# Patient Record
Sex: Female | Born: 2000 | Hispanic: Yes | Smoking: Never smoker
Health system: Southern US, Community
[De-identification: ages and names within clinical notes are randomized; demographics above are authoritative.]

## PROBLEM LIST (undated history)

## (undated) DIAGNOSIS — J45909 Unspecified asthma, uncomplicated: Secondary | ICD-10-CM

## (undated) DIAGNOSIS — R55 Syncope and collapse: Secondary | ICD-10-CM

## (undated) HISTORY — PX: WISDOM TOOTH EXTRACTION: SHX21

## (undated) HISTORY — DX: Unspecified asthma, uncomplicated: J45.909

---

## 2019-06-24 ENCOUNTER — Encounter: Payer: Self-pay | Admitting: Neurology

## 2019-06-24 ENCOUNTER — Ambulatory Visit: Payer: Self-pay | Admitting: Neurology

## 2019-06-24 ENCOUNTER — Telehealth: Payer: Self-pay

## 2019-06-24 NOTE — Telephone Encounter (Signed)
PT no show for new pt appt.

## 2019-07-05 ENCOUNTER — Ambulatory Visit: Payer: Self-pay | Admitting: Neurology

## 2019-08-03 ENCOUNTER — Encounter: Payer: Self-pay | Admitting: Neurology

## 2019-08-03 ENCOUNTER — Ambulatory Visit: Payer: 59 | Admitting: Neurology

## 2019-08-03 ENCOUNTER — Other Ambulatory Visit: Payer: Self-pay

## 2019-08-03 VITALS — BP 97/72 | HR 78 | Ht 66.0 in | Wt 175.6 lb

## 2019-08-03 DIAGNOSIS — R6889 Other general symptoms and signs: Secondary | ICD-10-CM | POA: Diagnosis not present

## 2019-08-03 DIAGNOSIS — R55 Syncope and collapse: Secondary | ICD-10-CM | POA: Diagnosis not present

## 2019-08-03 DIAGNOSIS — R569 Unspecified convulsions: Secondary | ICD-10-CM

## 2019-08-03 NOTE — Patient Instructions (Addendum)
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  I recommend further evaluation by checking echocardiogram, 30-day heart monitoring, EEG and MRI scan of the brain.  She was advised to increase participation in stress laxation activities like meditation and yoga and exercise.  She was advised not to drive for 6 months since her last episode as per [facility].  She will return for follow-up in the future in 2 months or call earlier if necessary.  Syncope Syncope is when you pass out (faint) for a short time. It is caused by a sudden decrease in blood flow to the brain. Signs that you may be about to pass out include:  Feeling dizzy or light-headed.  Feeling sick to your stomach (nauseous).  Seeing all white or all black.  Having cold, clammy skin. If you pass out, get help right away. Call your local emergency services (911 in the [place] Do not drive yourself to the hospital. Follow these instructions at home: Watch for any changes in your symptoms. Take these actions to stay safe and help with your symptoms: Lifestyle  Do not drive, use machinery, or play sports until your doctor says it is okay.  Do not drink alcohol.  Do not use any products that contain nicotine or tobacco, such as cigarettes and e-cigarettes. If you need help quitting, ask your doctor.  Drink enough fluid to keep your pee (urine) pale yellow. General instructions  Take over-the-counter and prescription medicines only as told by your doctor.  If you are taking blood pressure or heart medicine, sit up and stand up slowly. Spend a few minutes getting ready to sit and then stand. This can help you feel less dizzy.  Have someone stay with you until you feel stable.  If you start to feel like you might pass out, lie down right away and raise (elevate) your feet above the level of your heart.  Breathe deeply and steadily. Wait until all of the symptoms are gone.  Keep all follow-up visits as told by your doctor. This is important. Get help right away if:  You have a very bad headache.  You pass out once or more than once.  You have pain in your chest, belly, or back.  You have a very fast or uneven heartbeat (palpitations).  It hurts to breathe.  You are bleeding from your mouth or your bottom (rectum).  You have black or tarry poop (stool).  You have jerky movements that you cannot control (seizure).  You are confused.  You have trouble walking.  You are very weak.  You have vision problems. These symptoms may be an emergency. Do not wait to see if the symptoms will go away. Get medical help right away. Call your local emergency services (911 in the [place] Do not drive yourself to the hospital. Summary  Syncope is when you pass out (faint) for a short time. It is caused by a sudden decrease in blood flow to the brain.  Signs that you may be about to faint include feeling dizzy, light-headed, or sick to your stomach, seeing all white or all black, or having cold, clammy skin.  If you start to feel like you might pass out, lie down right away and raise (elevate) your feet above the level of your heart. Breathe deeply and steadily. Wait until all of the symptoms are gone. This information is not intended  to replace advice given to you by your health care provider. Make sure you discuss any questions you have with your health care provider. Document Revised: 03/19/2017 Document Reviewed: 03/19/2017 Elsevier Patient Education  2020 [employer].

## 2019-08-04 DIAGNOSIS — R55 Syncope and collapse: Secondary | ICD-10-CM | POA: Insufficient documentation
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
## 2019-08-05 ENCOUNTER — Telehealth: Payer: Self-pay | Admitting: Neurology

## 2019-08-05 NOTE — Telephone Encounter (Signed)
no to the covid questions MR Brain w/wo contrast Dr. Pearlean Brownie [facility] Auth: [id number] (exp. 08/05/19 to 09/19/19). Patient is scheduled at [facility] for 08/31/19

## 2019-08-30 ENCOUNTER — Other Ambulatory Visit: Payer: 59

## 2019-08-31 ENCOUNTER — Other Ambulatory Visit: Payer: 59

## 2019-09-01 ENCOUNTER — Other Ambulatory Visit (HOSPITAL_COMMUNITY): Payer: 59

## 2019-09-30 ENCOUNTER — Other Ambulatory Visit (HOSPITAL_COMMUNITY): Payer: 59

## 2019-10-04 ENCOUNTER — Telehealth (HOSPITAL_COMMUNITY): Payer: Self-pay | Admitting: Neurology

## 2019-10-04 ENCOUNTER — Encounter (HOSPITAL_COMMUNITY): Payer: Self-pay | Admitting: Neurology

## 2019-10-04 NOTE — Telephone Encounter (Signed)
Just an FYI. We have made several attempts to contact this patient including sending a letter to schedule or reschedule their echocardiogram. We will be removing the patient from the echo WQ.    10/04/19 MAILED LETTER  09/30/19 PT NO SHOWED  PA# [id number] 08/10/19 thru 09/24/19 (per inbasket) DC 08/10/19 Inbasket resent for PA#  08/03/19 INBASKET sent for PA#    Thank you

## 2019-10-11 ENCOUNTER — Ambulatory Visit: Payer: 59 | Admitting: Neurology

## 2019-11-25 ENCOUNTER — Encounter: Payer: Self-pay | Admitting: Neurology

## 2019-11-25 ENCOUNTER — Ambulatory Visit: Payer: 59 | Admitting: Neurology

## 2021-06-08 ENCOUNTER — Emergency Department (HOSPITAL_BASED_OUTPATIENT_CLINIC_OR_DEPARTMENT_OTHER): Payer: 59

## 2021-06-08 ENCOUNTER — Encounter (HOSPITAL_BASED_OUTPATIENT_CLINIC_OR_DEPARTMENT_OTHER): Payer: Self-pay | Admitting: Emergency Medicine

## 2021-06-08 ENCOUNTER — Emergency Department (HOSPITAL_BASED_OUTPATIENT_CLINIC_OR_DEPARTMENT_OTHER)
Admission: EM | Admit: 2021-06-08 | Discharge: 2021-06-08 | Disposition: A | Discharge status: Home / Self Care | Payer: 59 | Attending: Emergency Medicine | Admitting: Emergency Medicine

## 2021-06-08 ENCOUNTER — Other Ambulatory Visit: Payer: Self-pay

## 2021-06-08 DIAGNOSIS — R197 Diarrhea, unspecified: Secondary | ICD-10-CM | POA: Insufficient documentation

## 2021-06-08 DIAGNOSIS — R110 Nausea: Secondary | ICD-10-CM | POA: Diagnosis not present

## 2021-06-08 DIAGNOSIS — R509 Fever, unspecified: Secondary | ICD-10-CM | POA: Diagnosis not present

## 2021-06-08 DIAGNOSIS — R630 Anorexia: Secondary | ICD-10-CM | POA: Diagnosis not present

## 2021-06-08 DIAGNOSIS — R1031 Right lower quadrant pain: Secondary | ICD-10-CM | POA: Diagnosis not present

## 2021-06-08 DIAGNOSIS — D72829 Elevated white blood cell count, unspecified: Secondary | ICD-10-CM | POA: Diagnosis not present

## 2021-06-08 DIAGNOSIS — R1030 Lower abdominal pain, unspecified: Secondary | ICD-10-CM | POA: Diagnosis present

## 2021-06-08 LAB — URINALYSIS, ROUTINE W REFLEX MICROSCOPIC
Bilirubin Urine: NEGATIVE
Glucose, UA: NEGATIVE mg/dL
Ketones, ur: NEGATIVE mg/dL
Leukocytes,Ua: NEGATIVE
Nitrite: NEGATIVE
Protein, ur: NEGATIVE mg/dL
Specific Gravity, Urine: 1.017 (ref 1.005–1.030)
pH: 7 (ref 5.0–8.0)

## 2021-06-08 LAB — COMPREHENSIVE METABOLIC PANEL
ALT: 12 U/L (ref 0–44)
AST: 15 U/L (ref 15–41)
Albumin: 4.6 g/dL (ref 3.5–5.0)
Alkaline Phosphatase: 54 U/L (ref 38–126)
Anion gap: 9 (ref 5–15)
BUN: 12 mg/dL (ref 6–20)
CO2: 27 mmol/L (ref 22–32)
Calcium: 9.3 mg/dL (ref 8.9–10.3)
Chloride: 102 mmol/L (ref 98–111)
Creatinine, Ser: 0.94 mg/dL (ref 0.44–1.00)
GFR, Estimated: >60 mL/min (ref >60)
Glucose, Bld: 99 mg/dL (ref 70–99)
Potassium: 3.6 mmol/L (ref 3.5–5.1)
Sodium: 138 mmol/L (ref 135–145)
Total Bilirubin: 0.6 mg/dL (ref 0.3–1.2)
Total Protein: 7.4 g/dL (ref 6.5–8.1)

## 2021-06-08 LAB — CBC
HCT: 44.6 % (ref 36.0–46.0)
Hemoglobin: 14.6 g/dL (ref 12.0–15.0)
MCH: 29.7 pg (ref 26.0–34.0)
MCHC: 32.7 g/dL (ref 30.0–36.0)
MCV: 90.7 fL (ref 80.0–100.0)
Platelets: 255 10*3/uL (ref 150–400)
RBC: 4.92 MIL/uL (ref 3.87–5.11)
RDW: 12.5 % (ref 11.5–15.5)
WBC: 14.8 10*3/uL — ABNORMAL HIGH (ref 4.0–10.5)
nRBC: 0 % (ref 0.0–0.2)

## 2021-06-08 LAB — PREGNANCY, URINE: Preg Test, Ur: NEGATIVE

## 2021-06-08 LAB — LIPASE, BLOOD: Lipase: 10 U/L — ABNORMAL LOW (ref 11–51)

## 2021-06-08 MED ORDER — IOHEXOL 300 MG/ML  SOLN
100.0000 mL | Freq: Once | INTRAMUSCULAR | Status: AC | PRN
  Administered 2021-06-08: 100 mL via INTRAVENOUS

## 2021-06-08 NOTE — ED Triage Notes (Signed)
Pt reports waking up today with chills and RLQ pain.  States some nausea, no vomiting, 2 episodes of diarrhea and mild lower back pain. ?

## 2021-06-08 NOTE — Discharge Instructions (Signed)
Please read and follow all provided instructions. ? ?Your diagnoses today include:  ?1. RLQ abdominal pain   ? ? ?Tests performed today include: ?Blood cell counts and platelets: shows high infection fighting cell count ?Kidney and liver function tests ?Pancreas function test (called lipase) ?Urine test to look for infection ?A blood or urine test for pregnancy (women only) ?CT scan of your abdomen and pelvis: Does not show any sign of appendicitis or other serious causes of pain today ?Vital signs. See below for your results today.  ? ?Medications prescribed:  ?None ? ?Take any prescribed medications only as directed. ? ?Home care instructions:  ?Follow any educational materials contained in this packet. ? ?Follow-up instructions: ?Please follow-up with your primary care provider in the next 2 days for further evaluation of your symptoms.   ? ?Return instructions:  ?SEEK IMMEDIATE MEDICAL ATTENTION IF: ?The pain does not go away or becomes severe  ?A temperature above 101F develops  ?Repeated vomiting occurs (multiple episodes)  ?The pain becomes localized to portions of the abdomen. The right side could possibly be appendicitis. In an adult, the left lower portion of the abdomen could be colitis or diverticulitis.  ?Blood is being passed in stools or vomit (bright red or black tarry stools)  ?You develop chest pain, difficulty breathing, dizziness or fainting, or become confused, poorly responsive, or inconsolable (young children) ?If you have any other emergent concerns regarding your health ? ?Additional Information: ?Abdominal (belly) pain can be caused by many things. Your caregiver performed an examination and possibly ordered blood/urine tests and imaging (CT scan, x-rays, ultrasound). Many cases can be observed and treated at home after initial evaluation in the emergency department. Even though you are being discharged home, abdominal pain can be unpredictable. Therefore, you need a repeated exam if your  pain does not resolve, returns, or worsens. Most patients with abdominal pain don't have to be admitted to the hospital or have surgery, but serious problems like appendicitis and gallbladder attacks can start out as nonspecific pain. Many abdominal conditions cannot be diagnosed in one visit, so follow-up evaluations are very important. ? ?Your vital signs today were: ?BP 98/70   Pulse 84   Temp 98.2 ?F (36.8 ?C) (Oral)   Resp 16   Ht 5\' 5"  (1.651 m)   Wt 77.1 kg   LMP 05/18/2021 (Approximate)   SpO2 100%   BMI 28.29 kg/m?  ?If your blood pressure (bp) was elevated above 135/85 this visit, please have this repeated by your doctor within one month. ?-------------- ? ?

## 2021-06-08 NOTE — ED Provider Notes (Signed)
?[facility] EMERGENC[facility] DEPT ?Provider Note ? ? ?CSN: [id number] ?Arrival date & time: 06/08/21  1611 ? ?  ? ?History ? ?Chief Complaint  ?Patient presents with  ? Abdominal Pain  ? ? ?Tonya Park is a 21 y.o. female. ? ?Patient with no past surgical history presents to the emergency department today for evaluation of lower abdominal pain.  Symptoms started yesterday.  Earlier today symptoms were more severe.  Pain does not radiate.  Reports chills and subjective fever earlier today.  She has had nausea, decreased appetite but no vomiting.  Couple episodes of diarrhea noted.  No known sick contacts.  No dysuria, increased frequency urgency or hematuria noted.  No vaginal symptoms. ? ? ?  ? ?Home Medications ?Prior to Admission medications   ?Medication Sig Start Date End Date Taking? Authorizing Provider  ?testosterone (ANDROGEL) 50 MG/5GM (1%) GEL Place 5 g onto the skin daily.   Yes [provider]  ?albuterol (VENTOLIN HFA) 108 (90 Base) MCG/ACT inhaler every 4 (four) hours as needed.  03/11/19   [provider]  ?busPIRone (BUSPAR) 5 MG tablet as needed.  03/11/19   [provider]  ?hydrOXYzine (ATARAX/VISTARIL) 10 MG tablet Take 10 mg by mouth as needed.  03/11/19   [provider]  ?sertraline (ZOLOFT) 100 MG tablet  03/11/19   [provider]  ?   ? ?Allergies    ?Patient has no known allergies.   ? ?Review of Systems   ?Review of Systems ? ?Physical Exam ?Updated Vital Signs ?BP 98/70   Pulse 84   Temp 98.2 ?F (36.8 ?C) (Oral)   Resp 16   Ht 5\' 5"  (1.651 m)   Wt 77.1 kg   LMP 05/18/2021 (Approximate)   SpO2 100%   BMI 28.29 kg/m?  ?Physical Exam ?Vitals and nursing note reviewed.  ?Constitutional:   ?   General: She is not in acute distress. ?   Appearance: She is well-developed.  ?HENT:  ?   Head: Normocephalic and atraumatic.  ?   Right Ear: External ear normal.  ?   Left Ear: External ear normal.  ?   Nose: Nose normal.  ?Eyes:  ?    Conjunctiva/sclera: Conjunctivae normal.  ?Cardiovascular:  ?   Rate and Rhythm: Normal rate and regular rhythm.  ?   Heart sounds: No murmur heard. ?Pulmonary:  ?   Effort: No respiratory distress.  ?   Breath sounds: No wheezing, rhonchi or rales.  ?Abdominal:  ?   Palpations: Abdomen is soft.  ?   Tenderness: There is abdominal tenderness in the right lower quadrant and suprapubic area. There is no guarding or rebound.  ?Musculoskeletal:  ?   Cervical back: Normal range of motion and neck supple.  ?   Right lower leg: No edema.  ?   Left lower leg: No edema.  ?Skin: ?   General: Skin is warm and dry.  ?   Findings: No rash.  ?Neurological:  ?   General: No focal deficit present.  ?   Mental Status: She is alert. Mental status is at baseline.  ?   Motor: No weakness.  ?Psychiatric:     ?   Mood and Affect: Mood normal.  ? ? ?ED Results / Procedures / Treatments   ?Labs ?(all labs ordered are listed, but only abnormal results are displayed) ?Labs Reviewed  ?LIPASE, BLOOD - Abnormal; Notable for the following components:  ?    Result Value  ? Lipase 10 (*)   ?  All other components within normal limits  ?CBC - Abnormal; Notable for the following components:  ? WBC 14.8 (*)   ? All other components within normal limits  ?URINALYSIS, ROUTINE W REFLEX MICROSCOPIC - Abnormal; Notable for the following components:  ? Hgb urine dipstick TRACE (*)   ? All other components within normal limits  ?COMPREHENSIVE METABOLIC PANEL  ?PREGNANCY, URINE  ? ? ?EKG ?None ? ?Radiology ?CT ABDOMEN PELVIS W CONTRAST ? ?Result Date: 06/08/2021 ?CLINICAL DATA:  Right lower quadrant abdominal pain EXAM: CT ABDOMEN AND PELVIS WITH CONTRAST TECHNIQUE: Multidetector CT imaging of the abdomen and pelvis was performed using the standard protocol following bolus administration of intravenous contrast. RADIATION DOSE REDUCTION: This exam was performed according to the departmental dose-optimization program which includes automated exposure control,  adjustment of the mA and/or kV according to patient size and/or use of iterative reconstruction technique. CONTRAST:  114mL OMNIPAQUE IOHEXOL 300 MG/ML  SOLN COMPARISON:  None. FINDINGS: Lower Chest: Normal. Hepatobiliary: Normal hepatic contours. No intra- or extrahepatic biliary dilatation. The gallbladder is normal. Pancreas: Normal pancreas. No ductal dilatation or peripancreatic fluid collection. Spleen: Normal. Adrenals/Urinary Tract: The adrenal glands are normal. No hydronephrosis, nephroureterolithiasis or solid renal mass. The urinary bladder is normal for degree of distention Stomach/Bowel: There is no hiatal hernia. Normal duodenal course and caliber. No small bowel dilatation or inflammation. No focal colonic abnormality. Normal appendix. Vascular/Lymphatic: Normal course and caliber of the major abdominal vessels. No abdominal or pelvic lymphadenopathy. Reproductive: Normal uterus. No adnexal mass. Other: None. Musculoskeletal: No bony spinal canal stenosis or focal osseous abnormality. IMPRESSION: No acute abnormality of the abdomen or pelvis. Electronically Signed   By: Ulyses Jarred M.D.   On: 06/08/2021 20:12   ? ?Procedures ?Procedures  ? ? ?Medications Ordered in ED ?Medications  ?iohexol (OMNIPAQUE) 300 MG/ML solution 100 mL (100 mLs Intravenous Contrast Given 06/08/21 1952)  ? ? ?ED Course/ Medical Decision Making/ A&P ?  ? ?Patient seen and examined. History obtained directly from patient. Work-up including labs, imaging, EKG ordered in triage, if performed, were reviewed.   ? ?Labs/EKG: Independently reviewed and interpreted.  This included: Lipase normal; CMP unremarkable; CBC with elevated white blood cell count of 14,800, otherwise unremarkable; UA clear without signs of infection; pregnancy negative. ? ?Imaging: Ordered CT abdomen pelvis, recommended due to possibility of appendicitis. ? ?Medications/Fluids: Offered pain and nausea medication, patient declines. ? ?Most recent vital signs  reviewed and are as follows: ?BP 98/70   Pulse 84   Temp 98.2 ?F (36.8 ?C) (Oral)   Resp 16   Ht 5\' 5"  (1.651 m)   Wt 77.1 kg   LMP 05/18/2021 (Approximate)   SpO2 100%   BMI 28.29 kg/m?  ? ?Initial impression: Right lower quadrant pain, rule out appendicitis. ? ?8:41 PM Reassessment performed. Patient appears  ? ?Imaging personally visualized and interpreted including: CT of the abdomen pelvis, agree negative without acute findings ? ?Reviewed pertinent lab work and imaging with patient at bedside. Questions answered.  ? ?Most current vital signs reviewed and are as follows: ?BP 98/70   Pulse 84   Temp 98.2 ?F (36.8 ?C) (Oral)   Resp 16   Ht 5\' 5"  (1.651 m)   Wt 77.1 kg   LMP 05/18/2021 (Approximate)   SpO2 100%   BMI 28.29 kg/m?  ? ?Plan: Discharge to home.  ? ?Prescriptions written for: None, she states that she has Zofran prescribed by urgent care earlier ? ?Other home care instructions discussed:  Clear liquid diet, brat diet, slowly advancing ? ?ED return instructions discussed: The patient was urged to return to the Emergency Department immediately with worsening of current symptoms, worsening abdominal pain, persistent vomiting, blood noted in stools, fever, or any other concerns. The patient verbalized understanding.  ? ?Follow-up instructions discussed: Patient encouraged to follow-up with their PCP if not improving.  ? ? ? ? ? ? ? ?                        ?Medical Decision Making ?Amount and/or Complexity of Data Reviewed ?Labs: ordered. ?Radiology: ordered. ? ?Risk ?Prescription drug management. ? ? ?For this patient's complaint of abdominal pain, the following conditions were considered on the differential diagnosis: gastritis/PUD, enteritis/duodenitis, appendicitis, cholelithiasis/cholecystitis, cholangitis, pancreatitis, ruptured viscus, colitis, diverticulitis, proctitis, cystitis, pyelonephritis, ureteral colic, aortic dissection, aortic aneurysm. In women, ectopic pregnancy, pelvic  inflammatory disease, ovarian cysts, and tubo-ovarian abscess were also considered. Atypical chest etiologies were also considered including ACS, PE, and pneumonia.  ? ?The patient's vital signs, pertinent lab work and imaging were rev

## 2022-03-27 ENCOUNTER — Emergency Department (HOSPITAL_BASED_OUTPATIENT_CLINIC_OR_DEPARTMENT_OTHER)
Admission: EM | Admit: 2022-03-27 | Discharge: 2022-03-27 | Disposition: A | Discharge status: Home / Self Care | Payer: 59 | Attending: Emergency Medicine | Admitting: Emergency Medicine

## 2022-03-27 ENCOUNTER — Other Ambulatory Visit: Payer: Self-pay

## 2022-03-27 ENCOUNTER — Encounter (HOSPITAL_BASED_OUTPATIENT_CLINIC_OR_DEPARTMENT_OTHER): Payer: Self-pay | Admitting: Emergency Medicine

## 2022-03-27 ENCOUNTER — Emergency Department (HOSPITAL_BASED_OUTPATIENT_CLINIC_OR_DEPARTMENT_OTHER): Payer: 59 | Admitting: Radiology

## 2022-03-27 DIAGNOSIS — R61 Generalized hyperhidrosis: Secondary | ICD-10-CM | POA: Diagnosis not present

## 2022-03-27 DIAGNOSIS — J45909 Unspecified asthma, uncomplicated: Secondary | ICD-10-CM | POA: Insufficient documentation

## 2022-03-27 DIAGNOSIS — R072 Precordial pain: Secondary | ICD-10-CM | POA: Insufficient documentation

## 2022-03-27 DIAGNOSIS — R55 Syncope and collapse: Secondary | ICD-10-CM | POA: Diagnosis not present

## 2022-03-27 DIAGNOSIS — R079 Chest pain, unspecified: Secondary | ICD-10-CM | POA: Diagnosis present

## 2022-03-27 HISTORY — DX: Syncope and collapse: R55

## 2022-03-27 LAB — BASIC METABOLIC PANEL
Anion gap: 10 (ref 5–15)
BUN: 10 mg/dL (ref 6–20)
CO2: 28 mmol/L (ref 22–32)
Calcium: 9.9 mg/dL (ref 8.9–10.3)
Chloride: 100 mmol/L (ref 98–111)
Creatinine, Ser: 0.87 mg/dL (ref 0.44–1.00)
GFR, Estimated: >60 mL/min (ref >60)
Glucose, Bld: 92 mg/dL (ref 70–99)
Potassium: 3.4 mmol/L — ABNORMAL LOW (ref 3.5–5.1)
Sodium: 138 mmol/L (ref 135–145)

## 2022-03-27 LAB — CBC
HCT: 44.5 % (ref 36.0–46.0)
Hemoglobin: 15.1 g/dL — ABNORMAL HIGH (ref 12.0–15.0)
MCH: 30.9 pg (ref 26.0–34.0)
MCHC: 33.9 g/dL (ref 30.0–36.0)
MCV: 91.2 fL (ref 80.0–100.0)
Platelets: 234 10*3/uL (ref 150–400)
RBC: 4.88 MIL/uL (ref 3.87–5.11)
RDW: 12.4 % (ref 11.5–15.5)
WBC: 10.4 10*3/uL (ref 4.0–10.5)
nRBC: 0 % (ref 0.0–0.2)

## 2022-03-27 LAB — HCG, SERUM, QUALITATIVE: Preg, Serum: NEGATIVE

## 2022-03-27 LAB — TROPONIN I (HIGH SENSITIVITY)
Troponin I (High Sensitivity): <2 ng/L (ref <18)
Troponin I (High Sensitivity): <2 ng/L (ref <18)

## 2022-03-27 LAB — D-DIMER, QUANTITATIVE: D-Dimer, Quant: 0.42 ug/mL-FEU (ref 0.00–0.50)

## 2022-03-27 MED ORDER — LORAZEPAM 1 MG PO TABS
0.5000 mg | ORAL_TABLET | Freq: Once | ORAL | Status: AC
Start: 2022-03-27 — End: 2022-03-27
  Administered 2022-03-27: 0.5 mg via ORAL
  Filled 2022-03-27: qty 1

## 2022-03-27 MED ORDER — LIDOCAINE 5 % EX PTCH
1.0000 | MEDICATED_PATCH | Freq: Once | CUTANEOUS | Status: DC
  Administered 2022-03-27: 1 via TRANSDERMAL
  Filled 2022-03-27: qty 1

## 2022-03-27 MED ORDER — IBUPROFEN 800 MG PO TABS
800.0000 mg | ORAL_TABLET | Freq: Once | ORAL | Status: AC
  Administered 2022-03-27: 800 mg via ORAL
  Filled 2022-03-27: qty 1

## 2022-03-27 NOTE — ED Provider Notes (Signed)
[facility] Provider Note   CSN: [id number] Arrival date & time: 03/27/22  1320     History  No chief complaint on file.   Tylar Merendino is a 22 y.o. female.  Patient with history of asthma presents today with complaints of chest pain. He states that same began gradually throughout the day yesterday and into last night and has been persistent since. Pain is located in the right chest and does not radiate. Pain is reproducible to palpation and is pleuritic in nature. He states that he went to urgent care earlier today for evaluation of same around 1 pm and while sitting in the chair waiting in the lobby suddenly felt hot and sweaty and had a syncopal episode where he fell out of the chair and onto the ground. He did hit his face on the ground, was immediately conscious afterwards. Patients friend at bedside states he has been acting normally since the event. Given patients syncopal episode, urgent care called 911 and patient transported here via EMS for evaluation. He denies any history of similar symptoms previously. Denies recent travel, recent surgeries, leg pain or leg swelling. He is getting testosterone injections, no OCP or exogenous estrogen use. Denies headache, neck pain, vision changes, shortness of breath, nausea, vomiting, diarrhea, or abdominal pain.  The history is provided by the patient. No language interpreter was used.       Home Medications Prior to Admission medications   Medication Sig Start Date End Date Taking? Authorizing Provider  albuterol (VENTOLIN HFA) 108 (90 Base) MCG/ACT inhaler every 4 (four) hours as needed.  03/11/19   [provider]  busPIRone (BUSPAR) 5 MG tablet as needed.  03/11/19   [provider]  hydrOXYzine (ATARAX/VISTARIL) 10 MG tablet Take 10 mg by mouth as needed.  03/11/19   [provider]  sertraline (ZOLOFT) 100 MG tablet  03/11/19   [provider]   testosterone (ANDROGEL) 50 MG/5GM (1%) GEL Place 5 g onto the skin daily.    [provider]      Allergies    Patient has no known allergies.    Review of Systems   Review of Systems  Cardiovascular:  Positive for chest pain.  All other systems reviewed and are negative.   Physical Exam Updated Vital Signs BP 110/71   Pulse 80   Temp 97.9 F (36.6 C) (Oral)   Resp 19   SpO2 100%  Physical Exam Vitals and nursing note reviewed.  Constitutional:      General: She is not in acute distress.    Appearance: Normal appearance. She is normal weight. She is not ill-appearing, toxic-appearing or diaphoretic.  HENT:     Head: Normocephalic and atraumatic.     Comments: No battles sign or racoon eyes Eyes:     Extraocular Movements: Extraocular movements intact.     Pupils: Pupils are equal, round, and reactive to light.  Neck:     Comments: No tenderness to palpation of cervical spine Cardiovascular:     Rate and Rhythm: Normal rate and regular rhythm.     Pulses: Normal pulses.          Radial pulses are 2+ on the right side and 2+ on the left side.       Dorsalis pedis pulses are 2+ on the right side and 2+ on the left side.       Posterior tibial pulses are 2+ on the right side and 2+  on the left side.     Heart sounds: Normal heart sounds.  Pulmonary:     Effort: Pulmonary effort is normal. No respiratory distress.     Breath sounds: Normal breath sounds.  Chest:     Comments: Tenderness to palpation of the right anterior chest. No overlying skin changes, masses, or lesions present. Abdominal:     General: Abdomen is flat.     Palpations: Abdomen is soft.     Tenderness: There is no abdominal tenderness.  Musculoskeletal:        General: No tenderness. Normal range of motion.     Cervical back: Normal range of motion and neck supple.     Right lower leg: No edema.     Left lower leg: No edema.  Skin:    General: Skin is warm and dry.  Neurological:      General: No focal deficit present.     Mental Status: She is alert and oriented to person, place, and time.  Psychiatric:        Mood and Affect: Mood normal.        Behavior: Behavior normal.     ED Results / Procedures / Treatments   Labs (all labs ordered are listed, but only abnormal results are displayed) Labs Reviewed  BASIC METABOLIC PANEL - Abnormal; Notable for the following components:      Result Value   Potassium 3.4 (*)    All other components within normal limits  CBC - Abnormal; Notable for the following components:   Hemoglobin 15.1 (*)    All other components within normal limits  D-DIMER, QUANTITATIVE  HCG, SERUM, QUALITATIVE  TROPONIN I (HIGH SENSITIVITY)  TROPONIN I (HIGH SENSITIVITY)    EKG None  Radiology DG Chest 2 View  Result Date: 03/27/2022 CLINICAL DATA:  Chest pain EXAM: CHEST - 2 VIEW COMPARISON:  None Available. FINDINGS: The heart size and mediastinal contours are within normal limits. Both lungs are clear. The visualized skeletal structures are unremarkable. IMPRESSION: No active cardiopulmonary disease. Electronically Signed   By: Yetta Glassman M.D.   On: 03/27/2022 15:56    Procedures Procedures    Medications Ordered in ED Medications  lidocaine (LIDODERM) 5 % 1 patch (1 patch Transdermal Patch Applied 03/27/22 1730)  LORazepam (ATIVAN) tablet 0.5 mg (0.5 mg Oral Given 03/27/22 1530)  ibuprofen (ADVIL) tablet 800 mg (800 mg Oral Given 03/27/22 1729)    ED Course/ Medical Decision Making/ A&P             HEART Score: 0                Medical Decision Making Amount and/or Complexity of Data Reviewed Labs: ordered. Radiology: ordered.  Risk Prescription drug management.   This patient is a 22 y.o. female who presents to the ED for concern of chest pain and syncope, this involves an extensive number of treatment options, and is a complaint that carries with it a high risk of complications and morbidity. The emergent differential  diagnosis prior to evaluation includes, but is not limited to,  ACS, pericarditis, myocarditis, aortic dissection, PE, pneumothorax, esophageal spasm or rupture, chronic angina, pneumonia, bronchitis, GERD, reflux/PUD, biliary disease, pancreatitis, costochondritis, anxiety  This is not an exhaustive differential.   Past Medical History / Co-morbidities / Social History: Hx asthma  Physical Exam: Physical exam performed. The pertinent findings include: tenderness to palpation of right anterior chest. No other pertinent physical exam findings  Lab Tests: I ordered, and  personally interpreted labs.  The pertinent results include:  K 3.4. d dimer negative. Troponin negative and flat. No other acute laboratory findings   Imaging Studies: I ordered imaging studies including CXR. I independently visualized and interpreted imaging which showed NAD. I agree with the radiologist interpretation.   Cardiac Monitoring:  The patient was maintained on a cardiac monitor.  Cardiac monitor showed an underlying rhythm of: NSR, heart rate 100 bpm. I agree with this interpretation.   Medications: I ordered medication including ibuprofen and lidocaine patch  for pain. Reevaluation of the patient after these medicines showed that the patient improved. I have reviewed the patients home medicines and have made adjustments as needed.  Disposition:  Patient presents today with complaints of chest pain x 1 day with associated syncopal episode around 1 PM today.  He is afebrile, nontoxic-appearing, and in no acute distress with reassuring vital signs.  After evaluating all of the data points in this case, the presentation of Allycia Pitz is NOT consistent with Acute Coronary Syndrome (ACS) and/or myocardial ischemia, pulmonary embolism, aortic dissection; Borhaave's, significant arrythmia, pneumothorax, cardiac tamponade, or other emergent cardiopulmonary condition.  Further, the presentation of Cerinity Zynda  is NOT consistent with pericarditis, myocarditis,  mediastinitis, endocarditis, new valvular disease.  Additionally, the presentation of Tiffay Ellingtonis NOT consistent with flail chest, cardiac contusion, ARDS, or significant intra-thoracic bleeding.  Moreover, this presentation is NOT consistent with pneumonia, sepsis, or pyelonephritis.  The patient has a HEART Score: 0   He is PERC negative and with negative d dimer. Therefore no indication to pursue CTA. After lidocaine patch and ibuprofen, patients symptoms have improved. Denies any injuries from syncopal episode despite hitting head, and is acting normally without nausea, vomiting, headache, or vision changes. Therefore will defer CT imaging at this time. Likely vasovagal episode as patient has history of similar and endorses significant anxiety associated with healthcare settings. No additional presyncopal symptoms noted, able to walk around without feeling dizzy or lightheaded.  Patient is stable for discharge at this time, recommend close PCP follow-up for continued evaluation and management of symptoms.  Patient is understanding and amenable with plan, educated on red flag symptoms that would prompt immediate return.  Patient discharged in stable condition.   Strict return and follow-up precautions have been given by me personally or by detailed written instruction given verbally by nursing staff using the teach back method to the patient/family/caregiver(s).  Data Reviewed/Counseling: I have reviewed the patient's vital signs, nursing notes, and other relevant tests/information. I had a detailed discussion regarding the historical points, exam findings, and any diagnostic results supporting the discharge diagnosis. I also discussed the need for outpatient follow-up and the need to return to the ED if symptoms worsen or if there are any questions or concerns that arise at home.   Final Clinical Impression(s) / ED Diagnoses Final  diagnoses:  Precordial chest pain  Vasovagal syncope    Rx / DC Orders ED Discharge Orders     None     An After Visit Summary was printed and given to the patient.     Nestor Lewandowsky 03/27/22 Marion Downer, MD 03/28/22 2223

## 2022-03-27 NOTE — ED Notes (Signed)
EKG was completed in triage.  Copy given to Pat Patrick PA

## 2022-03-27 NOTE — ED Triage Notes (Signed)
Pt were seen for right shoulder for bursitis 2 days ago.

## 2022-03-27 NOTE — ED Triage Notes (Signed)
Right breast pain ,started yesterday,worse today. Went to urgent care ,passed out in the waiting room there , hit the floor ,landing on his head.

## 2022-03-27 NOTE — ED Triage Notes (Signed)
Feels worse with deep breath.

## 2022-03-27 NOTE — Discharge Instructions (Signed)
As we discussed, your workup in the ER today was reassuring for acute findings.  Laboratory evaluation, x-ray imaging, and EKG did not reveal any emergent concerns.  I recommend that you take Tylenol/ibuprofen as needed for pain and follow-up with your primary care doctor for continued evaluation and management of your symptoms.  Return if development of any new or worsening symptoms.

## 2022-06-26 ENCOUNTER — Ambulatory Visit
Admission: EM | Admit: 2022-06-26 | Discharge: 2022-06-26 | Disposition: A | Discharge status: Home / Self Care | Payer: 59

## 2022-06-26 ENCOUNTER — Ambulatory Visit (INDEPENDENT_AMBULATORY_CARE_PROVIDER_SITE_OTHER): Payer: 59

## 2022-06-26 DIAGNOSIS — S86912A Strain of unspecified muscle(s) and tendon(s) at lower leg level, left leg, initial encounter: Secondary | ICD-10-CM

## 2022-06-26 DIAGNOSIS — M25562 Pain in left knee: Secondary | ICD-10-CM | POA: Diagnosis not present

## 2022-06-26 DIAGNOSIS — M25561 Pain in right knee: Secondary | ICD-10-CM

## 2022-06-26 DIAGNOSIS — S86911A Strain of unspecified muscle(s) and tendon(s) at lower leg level, right leg, initial encounter: Secondary | ICD-10-CM | POA: Diagnosis not present

## 2022-06-26 MED ORDER — NAPROXEN 375 MG PO TABS: 375.0000 mg | ORAL_TABLET | Freq: Two times a day (BID) | ORAL | 0 refills | Status: AC | PRN

## 2022-06-26 NOTE — Discharge Instructions (Signed)
Knee braces to both knees as needed especially when you are at work to help support the knee You may take naproxen twice daily as needed for any pain.  Please take this with food Please follow-up with the PCP if your symptoms or not improving Please go to emergency room if you develop any worsening symptoms

## 2022-06-26 NOTE — ED Triage Notes (Signed)
Pt presents to UC w/ c/o bilateral knee pain x2 months which has gradually gotten worse. Pt takes iburofen and tylenol. Pt states she stands for most of the day at work.

## 2022-06-26 NOTE — ED Provider Notes (Addendum)
[facility]    CSN: [id number] Arrival date & time: 06/26/22  1501      History   Chief Complaint Chief Complaint  Patient presents with   Knee Pain    HPI Tonya Park is a 22 y.o. female presents for evaluation of knee pain.  Patient reports 2 months of a persistent waxing and waning bilateral anterior knee pain that is worse with weightbearing or activity.  Denies any known injury or inciting event.  Does state she works in [employer] and does a lot of walking going up stairs, bending, lifting, and getting on her knees on hard surfaces.  Denies any numbness/tingling, swelling, weakness.  She did state over the past few days she has noticed a "bump" on her right anterior knee that is not worsened or improved.  Again denies any known injury.  No history of bilateral knee injuries or surgeries/fractures in the past.  She has been taking Tylenol and ibuprofen and using ice with temporary improvement.  No other concerns at this time.   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    Patient Active Problem List   Diagnosis Date Noted   Syncope 08/04/2019    Past Surgical History:  Procedure Laterality Date   WISDOM TOOTH EXTRACTION Bilateral     OB History   No obstetric history on file.      Home Medications    Prior to Admission medications   Medication Sig Start Date End Date Taking? Authorizing Provider  ADDERALL XR 20 MG 24 hr capsule Take by mouth. 05/28/21  Yes [provider]  FLUoxetine (PROZAC) 40 MG capsule Take 1 tablet by mouth daily. 12/25/15  Yes [provider]  naproxen (NAPROSYN) 375 MG tablet Take 1 tablet (375 mg total) by mouth 2 (two) times daily as needed. 06/26/22  Yes Radford Pax, NP  propranolol (INDERAL) 10 MG tablet SMARTSIG:1 Tablet(s) By Mouth 03/30/22  Yes [provider]  albuterol (VENTOLIN HFA) 108  (90 Base) MCG/ACT inhaler every 4 (four) hours as needed.  03/11/19   [provider]  busPIRone (BUSPAR) 5 MG tablet as needed.  03/11/19   [provider]  hydrOXYzine (ATARAX/VISTARIL) 10 MG tablet Take 10 mg by mouth as needed.  03/11/19   [provider]  sertraline (ZOLOFT) 100 MG tablet  03/11/19   [provider]  testosterone (ANDROGEL) 50 MG/5GM (1%) GEL Place 5 g onto the skin daily.    [provider]    Family History History reviewed. No pertinent family history.  Social History Social History   Tobacco Use   Smoking status: Never   Smokeless tobacco: Never  Vaping Use   Vaping Use: Some days  Substance Use Topics   Alcohol use: Yes    Comment: occasional   Drug use: Yes    Types: Marijuana    Comment: occasional     Allergies   Patient has no known allergies.   Review of Systems Review of Systems  Musculoskeletal:        Bilateral knee pain      Physical Exam Triage Vital Signs ED Triage Vitals  Enc Vitals Group     BP 06/26/22 1510 100/68     Pulse Rate 06/26/22 1510 86     Resp 06/26/22 1510 16     Temp 06/26/22  1510 98.4 F (36.9 C)     Temp Source 06/26/22 1510 Oral     SpO2 06/26/22 1510 96 %     Weight --      Height --      Head Circumference --      Peak Flow --      Pain Score 06/26/22 1515 7     Pain Loc --      Pain Edu? --      Excl. in GC? --    No data found.  Updated Vital Signs BP 100/68 (BP Location: Left Arm)   Pulse 86   Temp 98.4 F (36.9 C) (Oral)   Resp 16   SpO2 96%   Visual Acuity Right Eye Distance:   Left Eye Distance:   Bilateral Distance:    Right Eye Near:   Left Eye Near:    Bilateral Near:     Physical Exam Vitals and nursing note reviewed.  Constitutional:      General: She is not in acute distress.    Appearance: Normal appearance. She is not ill-appearing.  HENT:     Head: Normocephalic and atraumatic.  Eyes:     Pupils: Pupils are equal,  round, and reactive to light.  Cardiovascular:     Rate and Rhythm: Normal rate.  Pulmonary:     Effort: Pulmonary effort is normal.  Musculoskeletal:     Right knee: No swelling, effusion, erythema, ecchymosis, lacerations or bony tenderness. Normal range of motion. Tenderness present over the medial joint line. No patellar tendon tenderness. Normal alignment, normal meniscus and normal patellar mobility.     Left knee: No swelling, deformity, effusion, erythema, ecchymosis, lacerations or bony tenderness. Normal range of motion. Tenderness present over the medial joint line. No patellar tendon tenderness. Normal alignment, normal meniscus and normal patellar mobility.       Legs:     Comments: There is a small, 2cm firm, non tender nodule to the right anterior knee, at tibial plateau.  No ecchymosis, erythema.  Full range of motion of both knees without restriction.  Positive valgus stress test bilaterally. Strength 5/5 BLE  Skin:    General: Skin is warm and dry.  Neurological:     General: No focal deficit present.     Mental Status: She is alert and oriented to person, place, and time.  Psychiatric:        Mood and Affect: Mood normal.        Behavior: Behavior normal.      UC Treatments / Results  Labs (all labs ordered are listed, but only abnormal results are displayed) Labs Reviewed - No data to display   Basic metabolic panel Order: [id number] Status: Final result     Visible to patient: Yes (not seen)     Next appt: None   0 Result Notes     Component Ref Range & Units 3 mo ago 1 yr ago  Sodium 135 - 145 mmol/L 138 138  Potassium 3.5 - 5.1 mmol/L 3.4 Low  3.6  Chloride 98 - 111 mmol/L 100 102  CO2 22 - 32 mmol/L 28 27  Glucose, Bld 70 - 99 mg/dL 92 99 CM  Comment: Glucose reference range applies only to samples taken after fasting for at least 8 hours.  BUN 6 - 20 mg/dL 10 12  Creatinine, Ser 0.44 - 1.00 mg/dL 4.09 8.11  Calcium 8.9 - 10.3 mg/dL 9.9  9.3  GFR, Estimated >60 mL/min >60 >60  CM  Comment: (NOTE) Calculated using the CKD-EPI Creatinine Equation (2021)  Anion gap 5 - 15 10 9  CM  Comment: Performed at [facility], [address], [place], [place] [address]  Resulting Agency [facility]     EKG   Radiology DG Knee Complete 4 Views Right  Result Date: 06/26/2022 CLINICAL DATA:  2 month history of knee pain. EXAM: RIGHT KNEE - COMPLETE 4+ VIEW COMPARISON:  None Available. FINDINGS: No evidence of fracture, dislocation, or joint effusion. No evidence of arthropathy or other focal bone abnormality. Soft tissues are unremarkable. IMPRESSION: Negative. Electronically Signed   By: Kennith Center M.D.   On: 06/26/2022 15:45    Procedures Procedures (including critical care time)  Medications Ordered in UC Medications - No data to display  Initial Impression / Assessment and Plan / UC Course  I have reviewed the triage vital signs and the nursing notes.  Pertinent labs & imaging results that were available during my care of the patient were reviewed by me and considered in my medical decision making (see chart for details).    Reviewed recent labs and progress notes Reviewed exam and symptoms with patient.  No red flags.  X-ray negative for any abnormalities/fracture.  Discussed follow-up on a likely cyst and just to continue to monitor Patient was fitted with bilateral neoprene knee sleeves and will wear primarily when she is at work Can start naproxen twice daily as needed for pain. Advised patient to follow-up with PCP if symptoms do not improve ER precautions reviewed and patient verbalized understanding Final Clinical Impressions(s) / UC Diagnoses   Final diagnoses:  Acute pain of both knees  Acute pain of right knee  Strain of both knees, initial encounter     Discharge Instructions      Knee braces to both knees as needed especially when you are at work to help support the  knee You may take naproxen twice daily as needed for any pain.  Please take this with food Please follow-up with the PCP if your symptoms or not improving Please go to emergency room if you develop any worsening symptoms     ED Prescriptions     Medication Sig Dispense Auth. Provider   naproxen (NAPROSYN) 375 MG tablet Take 1 tablet (375 mg total) by mouth 2 (two) times daily as needed. 14 tablet Radford Pax, NP      PDMP not reviewed this encounter.   Radford Pax, NP 06/26/22 1559    Radford Pax, NP 06/26/22 [phone]

## 2024-02-10 IMAGING — CT CT ABD-PELV W/ CM
2 of 4 series · 16 of 46 positions shown, 18 images · IV contrast (APPLIED)
Comparison: None.

CLINICAL DATA: Right lower quadrant abdominal pain

EXAM:
CT ABDOMEN AND PELVIS WITH CONTRAST
TECHNIQUE: Multidetector CT imaging of the abdomen and pelvis was performed
using the standard protocol following bolus administration of
intravenous contrast.

[Series 2: abd pel w · axial · 0.61mm/px · z∈[+549,+974]mm · 13 of 93 slices shown, 15 images]
[im 4/93  soft-tissue]
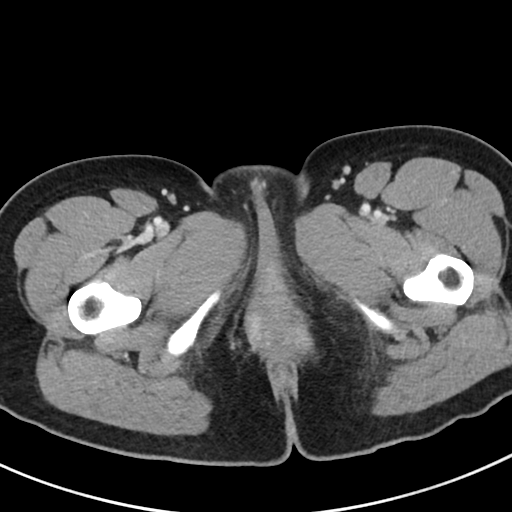
[im 4/93  bone]
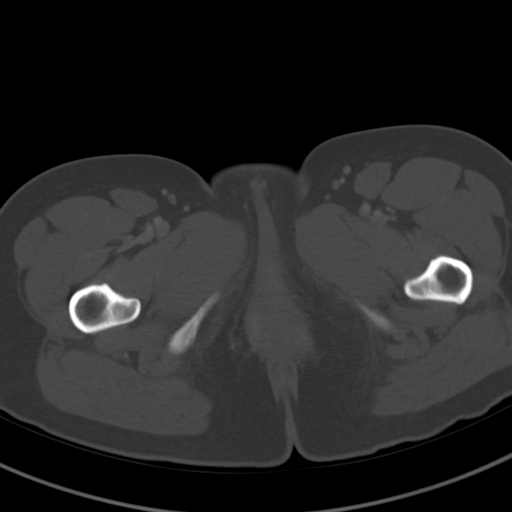
[im 11/93  soft-tissue]
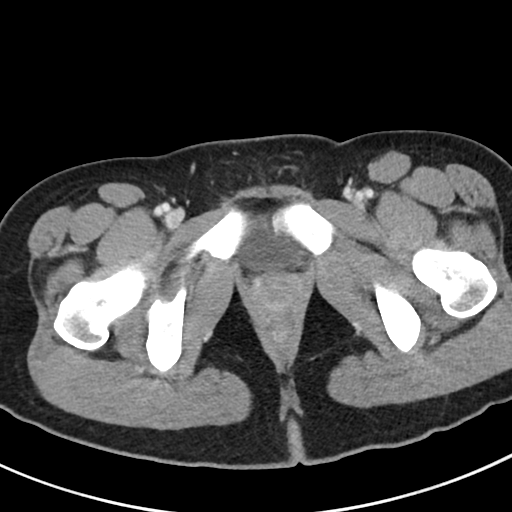
[im 18/93  soft-tissue]
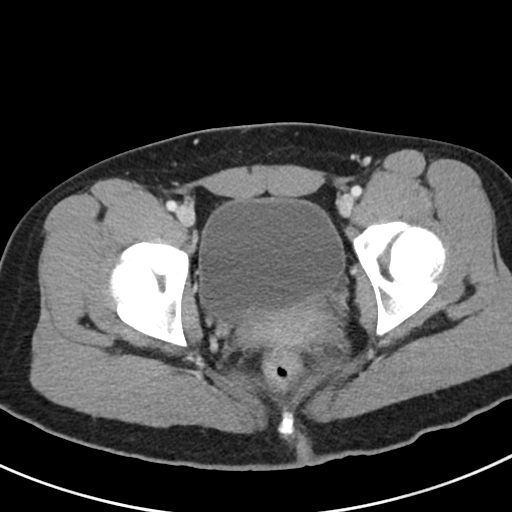
[im 25/93  soft-tissue]
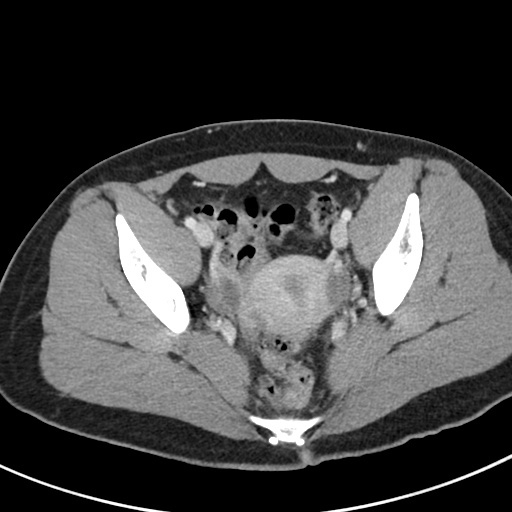
[im 32/93  soft-tissue]
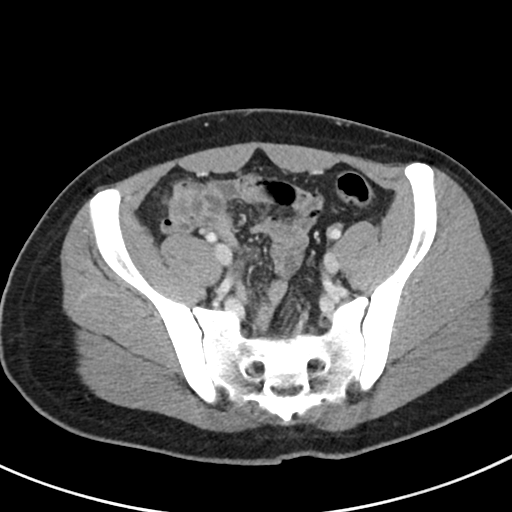
[im 39/93  soft-tissue]
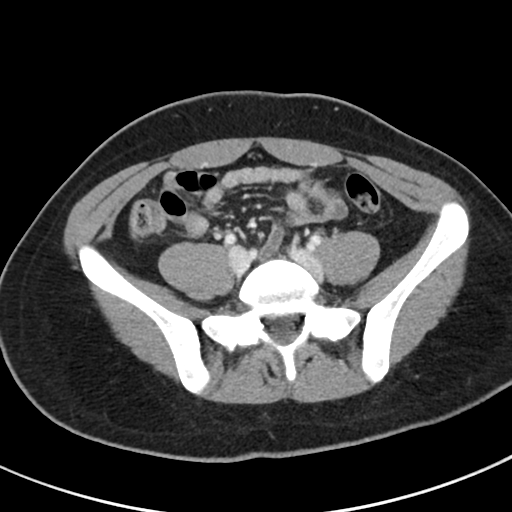
[im 47/93  soft-tissue]
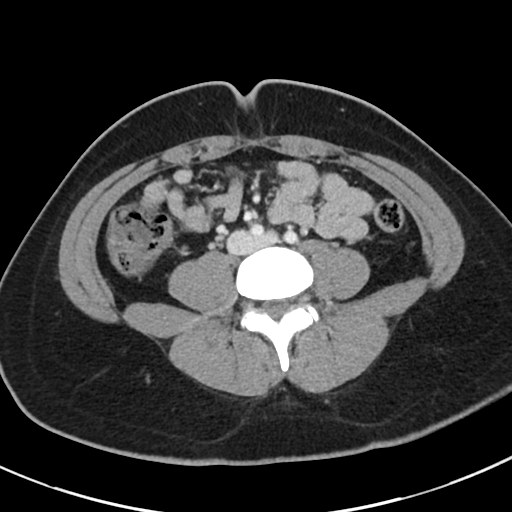
[im 54/93  soft-tissue]
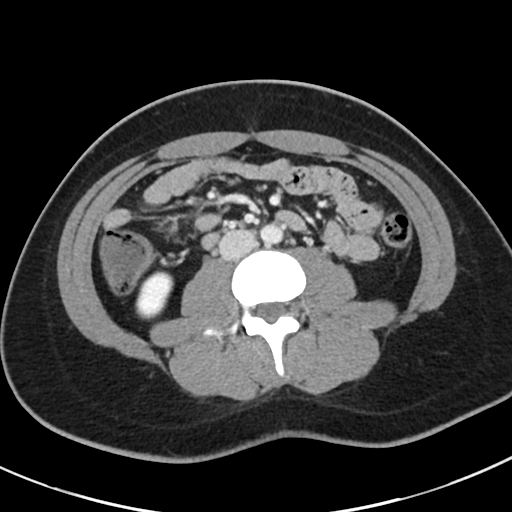
[im 61/93  soft-tissue]
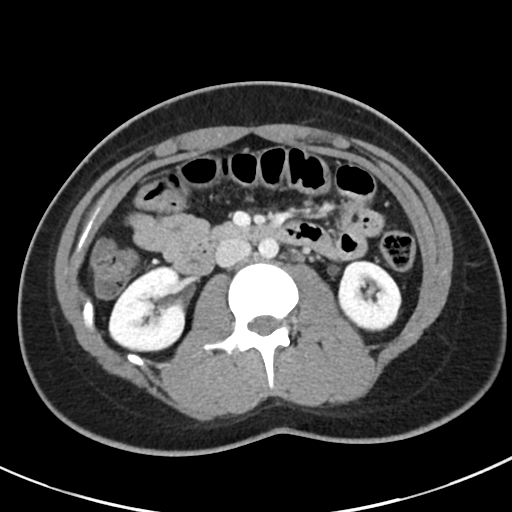
[im 61/93  bone]
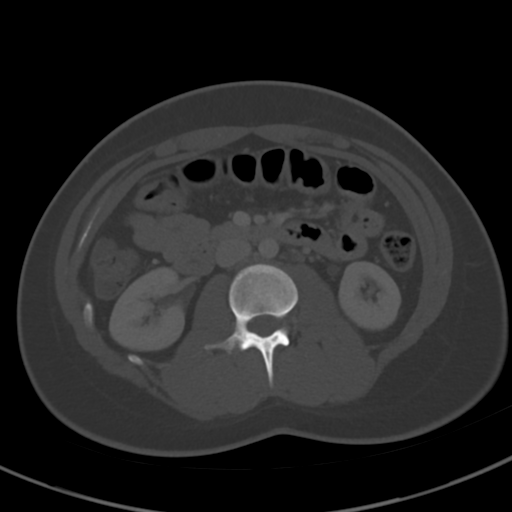
[im 68/93  soft-tissue]
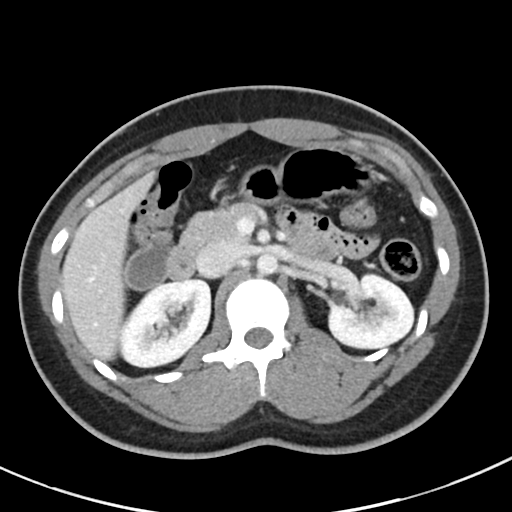
[im 75/93  soft-tissue]
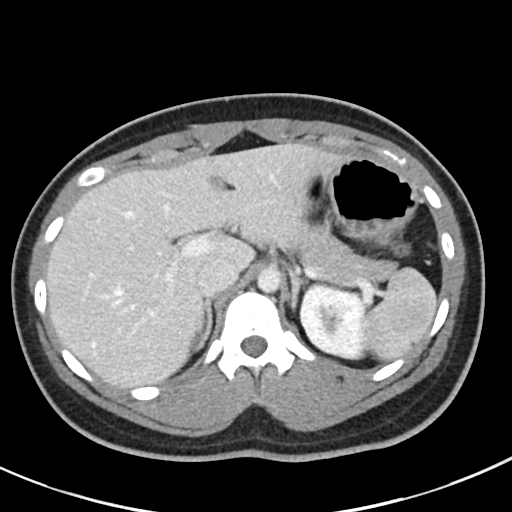
[im 82/93  soft-tissue]
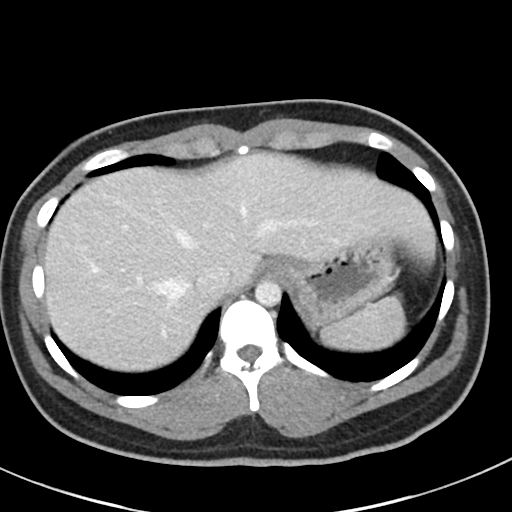
[im 89/93  soft-tissue]
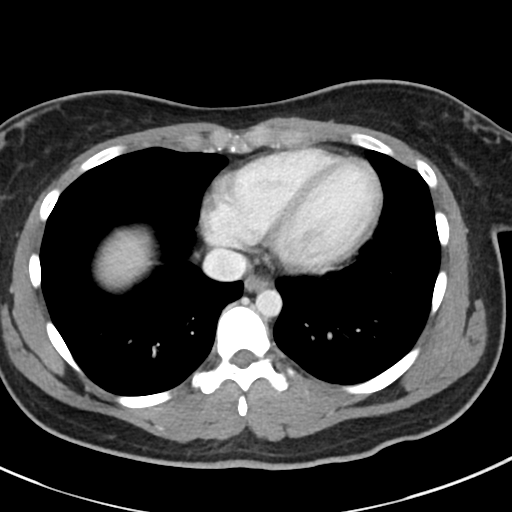

[Series 5: coronal · coronal · 0.65mm/px · 3 of 82 slices shown]
[im 28/82  soft-tissue]
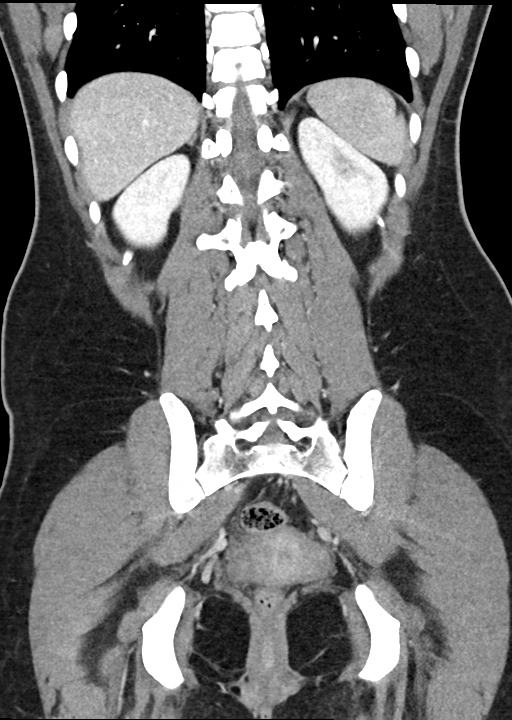
[im 37/82  soft-tissue]
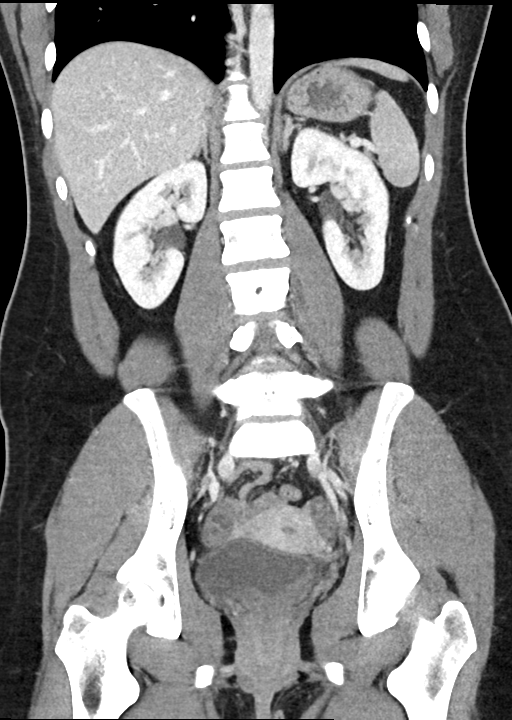
[im 46/82  soft-tissue]
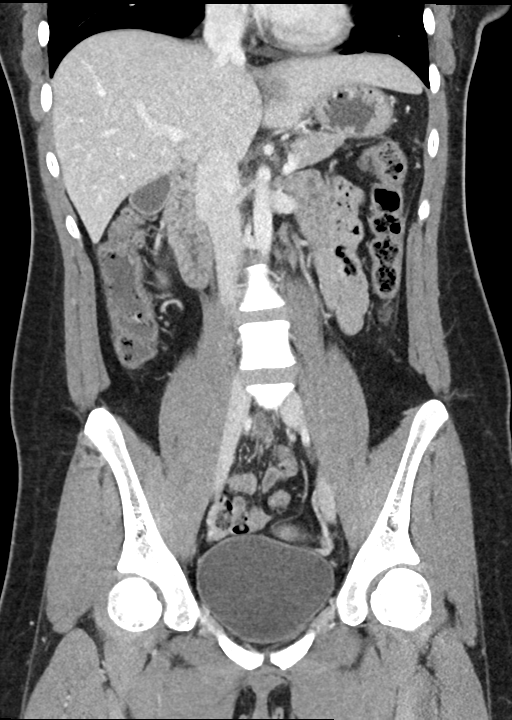

[16 of 46 positions shown; findings below may reference images not displayed]

RADIATION DOSE REDUCTION: This exam was performed according to the
departmental dose-optimization program which includes automated
exposure control, adjustment of the mA and/or kV according to
patient size and/or use of iterative reconstruction technique.

CONTRAST:  100mL OMNIPAQUE IOHEXOL 300 MG/ML  SOLN
FINDINGS: Lower Chest: Normal.

Hepatobiliary: Normal hepatic contours. No intra- or extrahepatic
biliary dilatation. The gallbladder is normal.

Pancreas: Normal pancreas. No ductal dilatation or peripancreatic
fluid collection.

Spleen: Normal.

Adrenals/Urinary Tract: The adrenal glands are normal. No
hydronephrosis, nephroureterolithiasis or solid renal mass. The
urinary bladder is normal for degree of distention

Stomach/Bowel: There is no hiatal hernia. Normal duodenal course and
caliber. No small bowel dilatation or inflammation. No focal colonic
abnormality. Normal appendix.

Vascular/Lymphatic: Normal course and caliber of the major abdominal
vessels. No abdominal or pelvic lymphadenopathy.

Reproductive: Normal uterus. No adnexal mass.

Other: None.

Musculoskeletal: No bony spinal canal stenosis or focal osseous
abnormality.
IMPRESSION: No acute abnormality of the abdomen or pelvis.
